# Patient Record
Sex: Female | Born: 1945 | Race: Black or African American | Hispanic: No | Marital: Married | Smoking: Never smoker
Health system: Southern US, Community
[De-identification: ages and names within clinical notes are randomized; demographics above are authoritative.]

## PROBLEM LIST (undated history)

## (undated) DIAGNOSIS — E785 Hyperlipidemia, unspecified: Secondary | ICD-10-CM

## (undated) DIAGNOSIS — I10 Essential (primary) hypertension: Secondary | ICD-10-CM

## (undated) HISTORY — PX: ABDOMINAL HYSTERECTOMY: SHX81

## (undated) HISTORY — PX: OTHER SURGICAL HISTORY: SHX169

---

## 2010-12-26 ENCOUNTER — Ambulatory Visit: Payer: Self-pay | Admitting: Internal Medicine

## 2010-12-28 ENCOUNTER — Ambulatory Visit: Payer: Self-pay | Admitting: Internal Medicine

## 2013-08-25 ENCOUNTER — Ambulatory Visit: Payer: Self-pay | Admitting: Physician Assistant

## 2013-11-02 ENCOUNTER — Ambulatory Visit: Payer: Self-pay | Admitting: Emergency Medicine

## 2013-11-02 LAB — RAPID STREP-A WITH REFLX: Micro Text Report: NEGATIVE

## 2013-11-05 LAB — BETA STREP CULTURE(ARMC)

## 2014-02-13 ENCOUNTER — Ambulatory Visit: Payer: Self-pay | Admitting: Emergency Medicine

## 2014-03-01 ENCOUNTER — Ambulatory Visit: Payer: Self-pay

## 2014-03-01 LAB — URINALYSIS, COMPLETE
Bilirubin,UR: NEGATIVE
Glucose,UR: NEGATIVE
Ketone: NEGATIVE
NITRITE: POSITIVE
Ph: 5.5 (ref 5.0–8.0)
Protein: NEGATIVE
SPECIFIC GRAVITY: 1.025 (ref 1.000–1.030)

## 2014-03-01 LAB — COMPREHENSIVE METABOLIC PANEL
ALK PHOS: 64 U/L
Albumin: 3.5 g/dL (ref 3.4–5.0)
Anion Gap: 3 — ABNORMAL LOW (ref 7–16)
BUN: 18 mg/dL (ref 7–18)
Bilirubin,Total: 0.6 mg/dL (ref 0.2–1.0)
CALCIUM: 9.5 mg/dL (ref 8.5–10.1)
Chloride: 102 mmol/L (ref 98–107)
Co2: 34 mmol/L — ABNORMAL HIGH (ref 21–32)
Creatinine: 0.87 mg/dL (ref 0.60–1.30)
EGFR (African American): >60
Glucose: 88 mg/dL (ref 65–99)
Osmolality: 279 (ref 275–301)
POTASSIUM: 2.9 mmol/L — AB (ref 3.5–5.1)
SGOT(AST): 25 U/L (ref 15–37)
SGPT (ALT): 22 U/L
Sodium: 139 mmol/L (ref 136–145)
Total Protein: 7 g/dL (ref 6.4–8.2)

## 2014-03-01 LAB — CBC WITH DIFFERENTIAL/PLATELET
BASOS ABS: 0.1 10*3/uL (ref 0.0–0.1)
Basophil %: 1.2 %
Eosinophil #: 0.2 10*3/uL (ref 0.0–0.7)
Eosinophil %: 4.1 %
HCT: 38.5 % (ref 35.0–47.0)
HGB: 12.8 g/dL (ref 12.0–16.0)
Lymphocyte #: 1.6 10*3/uL (ref 1.0–3.6)
Lymphocyte %: 31.8 %
MCH: 30 pg (ref 26.0–34.0)
MCHC: 33.3 g/dL (ref 32.0–36.0)
MCV: 90 fL (ref 80–100)
MONOS PCT: 7.2 %
Monocyte #: 0.4 x10 3/mm (ref 0.2–0.9)
Neutrophil #: 2.8 10*3/uL (ref 1.4–6.5)
Neutrophil %: 55.7 %
PLATELETS: 254 10*3/uL (ref 150–440)
RBC: 4.27 10*6/uL (ref 3.80–5.20)
RDW: 13.6 % (ref 11.5–14.5)
WBC: 5 10*3/uL (ref 3.6–11.0)

## 2014-03-01 LAB — LIPASE, BLOOD: Lipase: 156 U/L (ref 73–393)

## 2014-03-01 LAB — AMYLASE: Amylase: 75 U/L (ref 25–115)

## 2014-03-01 LAB — OCCULT BLOOD X 1 CARD TO LAB, STOOL: Occult Blood, Feces: NEGATIVE

## 2014-03-03 LAB — URINE CULTURE

## 2015-04-19 ENCOUNTER — Ambulatory Visit
Admission: EM | Admit: 2015-04-19 | Discharge: 2015-04-19 | Disposition: A | Discharge status: Home / Self Care | Payer: Medicare Other | Attending: Family Medicine | Admitting: Family Medicine

## 2015-04-19 ENCOUNTER — Encounter: Payer: Self-pay | Admitting: Emergency Medicine

## 2015-04-19 DIAGNOSIS — J0110 Acute frontal sinusitis, unspecified: Secondary | ICD-10-CM

## 2015-04-19 DIAGNOSIS — H6593 Unspecified nonsuppurative otitis media, bilateral: Secondary | ICD-10-CM

## 2015-04-19 DIAGNOSIS — J209 Acute bronchitis, unspecified: Secondary | ICD-10-CM | POA: Diagnosis not present

## 2015-04-19 HISTORY — DX: Essential (primary) hypertension: I10

## 2015-04-19 HISTORY — DX: Hyperlipidemia, unspecified: E78.5

## 2015-04-19 MED ORDER — SALINE SPRAY 0.65 % NA SOLN
2.0000 | NASAL | Status: AC
Start: 2015-04-19 — End: ?

## 2015-04-19 MED ORDER — FLUTICASONE PROPIONATE 50 MCG/ACT NA SUSP: 1.0000 | Freq: Two times a day (BID) | NASAL | Status: AC

## 2015-04-19 MED ORDER — DOXYCYCLINE HYCLATE 100 MG PO CAPS: 100.0000 mg | ORAL_CAPSULE | Freq: Two times a day (BID) | ORAL | Status: AC

## 2015-04-19 MED ORDER — IPRATROPIUM-ALBUTEROL 0.5-2.5 (3) MG/3ML IN SOLN
3.0000 mL | Freq: Four times a day (QID) | RESPIRATORY_TRACT | Status: DC
  Administered 2015-04-19: 3 mL via RESPIRATORY_TRACT

## 2015-04-19 NOTE — ED Provider Notes (Signed)
CSN: [id number]     Arrival date & time 04/19/15  1219 History   First MD Initiated Contact with Patient 04/19/15 1249     Chief Complaint  Patient presents with  . URI  . Laryngitis   (Consider location/radiation/quality/duration/timing/severity/associated sxs/prior Treatment) HPI Comments: Married african [place] female left work early as feeling worse and they made her work under ceiling fans worsened respiratory symptoms.  Started with green phlegm cough 22 Nov, sinus pressure/congestion right greater than left, frontal headache 22 Nov has resolved, lost her voice worst current symptoms along with post nasal drip  Needs work note  The history is provided by the patient.    Past Medical History  Diagnosis Date  . Hypertension   . Hyperlipidemia    Past Surgical History  Procedure Laterality Date  . Gallstone    . Abdominal hysterectomy     History reviewed. No pertinent family history. Social History  Substance Use Topics  . Smoking status: Never Smoker   . Smokeless tobacco: None  . Alcohol Use: No   OB History    No data available     Review of Systems  Constitutional: Negative for fever, chills, diaphoresis, activity change, appetite change, fatigue and unexpected weight change.  HENT: Positive for congestion, ear pain, nosebleeds, postnasal drip, sinus pressure, sore throat and voice change. Negative for dental problem, drooling, ear discharge, facial swelling, hearing loss, mouth sores, rhinorrhea, sneezing, tinnitus and trouble swallowing.   Eyes: Negative for photophobia, pain, discharge, redness, itching and visual disturbance.  Respiratory: Positive for cough. Negative for choking, chest tightness, shortness of breath, wheezing and stridor.   Cardiovascular: Negative for chest pain, palpitations and leg swelling.  Gastrointestinal: Negative for nausea, vomiting, abdominal pain, diarrhea, constipation, blood in stool and abdominal distention.  Endocrine:  Negative for cold intolerance and heat intolerance.  Genitourinary: Negative for dysuria, hematuria and difficulty urinating.  Musculoskeletal: Negative for myalgias, back pain, joint swelling, arthralgias, gait problem, neck pain and neck stiffness.  Skin: Negative for color change, pallor, rash and wound.  Allergic/Immunologic: Positive for environmental allergies. Negative for food allergies.  Neurological: Positive for headaches. Negative for dizziness, tremors, seizures, syncope, facial asymmetry, speech difficulty, weakness, light-headedness and numbness.  Hematological: Negative for adenopathy. Does not bruise/bleed easily.  Psychiatric/Behavioral: Negative for behavioral problems, confusion, sleep disturbance and agitation.    Allergies  Review of patient's allergies indicates no known allergies.  Home Medications   Prior to Admission medications   Medication Sig Start Date End Date Taking? Authorizing Provider  ezetimibe-simvastatin (VYTORIN) 10-40 MG tablet Take 1 tablet by mouth daily.   Yes Historical Provider, MD  potassium chloride SA (K-DUR,KLOR-CON) 20 MEQ tablet Take 20 mEq by mouth 2 (two) times daily.   Yes Historical Provider, MD  doxycycline (VIBRAMYCIN) 100 MG capsule Take 1 capsule (100 mg total) by mouth 2 (two) times daily. 04/19/15   Barbaraann Barthelina A Bronwyn Belasco, NP  fluticasone (FLONASE) 50 MCG/ACT nasal spray Place 1 spray into both nostrils 2 (two) times daily. 04/19/15 05/03/15  Barbaraann Barthelina A Jeanluc Wegman, NP  sodium chloride (OCEAN) 0.65 % SOLN nasal spray Place 2 sprays into both nostrils every 2 (two) hours while awake. 04/19/15   Barbaraann Barthelina A Caton Popowski, NP   Meds Ordered and Administered this Visit   Medications  ipratropium-albuterol (DUONEB) 0.5-2.5 (3) MG/3ML nebulizer solution 3 mL (3 mLs Nebulization Given 04/19/15 1301)    BP 140/78 mmHg  Pulse 65  Temp(Src) 97.3 F (36.3 C) (Tympanic)  Resp 18  Ht 5'  4" (1.626 m)  Wt 163 lb (73.936 kg)  BMI 27.97 kg/m2  SpO2  99% No data found.   Physical Exam  Constitutional: She is oriented to person, place, and time. She appears well-developed and well-nourished. She is active and cooperative.  Non-toxic appearance. She does not have a sickly appearance. She appears ill. No distress.  HENT:  Head: Normocephalic and atraumatic.  Right Ear: Hearing, external ear and ear canal normal. A middle ear effusion is present.  Left Ear: Hearing, external ear and ear canal normal. A middle ear effusion is present.  Nose: Mucosal edema and rhinorrhea present. No nose lacerations, sinus tenderness, nasal deformity, septal deviation or nasal septal hematoma. No epistaxis.  No foreign bodies. Right sinus exhibits no maxillary sinus tenderness and no frontal sinus tenderness. Left sinus exhibits no maxillary sinus tenderness and no frontal sinus tenderness.  Mouth/Throat: Uvula is midline and mucous membranes are normal. Mucous membranes are not pale, not dry and not cyanotic. She does not have dentures. No oral lesions. No trismus in the jaw. Normal dentition. No dental abscesses, uvula swelling, lacerations or dental caries. Posterior oropharyngeal edema and posterior oropharyngeal erythema present. No oropharyngeal exudate or tonsillar abscesses.  Cobblestoning posterior pharynx; bilateral TMs with air fluid level clear; hoarse voice; nonproductive cough; bilateral nasal turbinates with edema/boggy clear discharge   Eyes: Conjunctivae, EOM and lids are normal. Pupils are equal, round, and reactive to light. Right eye exhibits no chemosis, no discharge, no exudate and no hordeolum. No foreign body present in the right eye. Left eye exhibits no chemosis, no discharge, no exudate and no hordeolum. No foreign body present in the left eye. Right conjunctiva is not injected. Right conjunctiva has no hemorrhage. Left conjunctiva is not injected. Left conjunctiva has no hemorrhage. No scleral icterus. Right eye exhibits normal extraocular  motion and no nystagmus. Left eye exhibits normal extraocular motion and no nystagmus. Right pupil is round and reactive. Left pupil is round and reactive. Pupils are equal.  Neck: Trachea normal and normal range of motion. Neck supple. No tracheal tenderness, no spinous process tenderness and no muscular tenderness present. No rigidity. No tracheal deviation, no edema, no erythema and normal range of motion present. No thyroid mass and no thyromegaly present.  Cardiovascular: Normal rate, regular rhythm, S1 normal, S2 normal, normal heart sounds and intact distal pulses.  PMI is not displaced.  Exam reveals no gallop and no friction rub.   No murmur heard. Pulmonary/Chest: Effort normal. No accessory muscle usage or stridor. No respiratory distress. She has decreased breath sounds in the right lower field and the left lower field. She has no wheezes. She has rhonchi in the right middle field and the left middle field. She has no rales. She exhibits no tenderness.  Abdominal: Soft. She exhibits no distension.  Musculoskeletal: Normal range of motion. She exhibits no edema or tenderness.       Right shoulder: Normal.       Left shoulder: Normal.       Right hip: Normal.       Left hip: Normal.       Right knee: Normal.       Left knee: Normal.       Cervical back: Normal.       Right hand: Normal.       Left hand: Normal.  Lymphadenopathy:       Head (right side): No submental, no submandibular, no tonsillar, no preauricular, no posterior auricular and no occipital  adenopathy present.       Head (left side): No submental, no submandibular, no tonsillar, no preauricular, no posterior auricular and no occipital adenopathy present.    She has no cervical adenopathy.       Right cervical: No superficial cervical, no deep cervical and no posterior cervical adenopathy present.      Left cervical: No superficial cervical, no deep cervical and no posterior cervical adenopathy present.  Neurological: She  is alert and oriented to person, place, and time. She has normal strength. She is not disoriented. She displays no atrophy and no tremor. No cranial nerve deficit or sensory deficit. She exhibits normal muscle tone. She displays no seizure activity. Coordination and gait normal. GCS eye subscore is 4. GCS verbal subscore is 5. GCS motor subscore is 6.  Skin: Skin is warm, dry and intact. No abrasion, no bruising, no burn, no ecchymosis, no laceration, no lesion, no petechiae and no rash noted. She is not diaphoretic. No cyanosis or erythema. No pallor. Nails show no clubbing.  Psychiatric: She has a normal mood and affect. Her speech is normal and behavior is normal. Judgment and thought content normal. Cognition and memory are normal.  Nursing note and vitals reviewed.   ED Course  Procedures (including critical care time)  Labs Review Labs Reviewed - No data to display  Imaging Review No results found.  1335 Sp02 99% s/p duoneb treatment given by CMA Riesa Pope at [phone].  Patient doesn't feel any better but improved airflow throughout lung fields on repeat exam cough resolved voice still hoarse.  Offered albuterol MDI Rx for patient to use at home and she refused.  Continue plan of care as previously discussed and work note given for 24 hours.  Patient verbalized understanding of information/instructions, agreed with plan of care and had no further questions at this time.  MDM   1. Acute bronchitis, unspecified organism   2. Otitis media with effusion, bilateral   3. Acute frontal sinusitis, recurrence not specified    Supportive treatment.   No evidence of invasive bacterial infection, non toxic and well hydrated.  This is most likely self limiting viral infection.  I do not see where any further testing or imaging is necessary at this time.   I will suggest supportive care, rest, good hygiene and encourage the patient to take adequate fluids.  The patient is to return to clinic or  EMERGENCY ROOM if symptoms worsen or change significantly e.g. ear pain, fever, purulent discharge from ears or bleeding.  Exitcare handout on otitis media with effusion given to patient.  Patient verbalized agreement and understanding of treatment plan.    Suspect Viral illness: no evidence of invasive bacterial infection, non toxic and well hydrated.  This is most likely self limiting viral infection.  I do not see where any further testing or imaging is necessary at this time.   I will suggest supportive care, rest, good hygiene and encourage the patient to take adequate fluids.  Does not require work excuse x 24 hours.  flonase 1 spray each nostril BID prn, nasal saline 1-2 sprays each nostril prn q2h, if no improvement 48 hours on flonase start doxycycline  po BID x 10 days.  Discussed honey with lemon and salt water gargles for comfort also.  The patient is to return to clinic or EMERGENCY ROOM if symptoms worsen or change significantly e.g. fever, lethargy, SOB, wheezing.  Exitcare handout on viral illness given to patient.  Patient verbalized  agreement and understanding of treatment plan.   Bronchitis simple, community acquired, may have started as viral (probably respiratory syncytial, parainfluenza, influenza, or adenovirus), but now evidence of acute purulent bronchitis with resultant bronchial edema and mucus formation.  Viruses are the most common cause of bronchial inflammation in otherwise healthy adults with acute bronchitis.  The appearance of sputum is not predictive of whether a bacterial infection is present.  Purulent sputum is most often caused by viral infections.  There are a small portion of those caused by non-viral agents being Mycoplamsa pneumonia.  Microscopic examination or C&S of sputum in the healthy adult with acute bronchitis is generally not helpful (usually negative or normal respiratory flora) other considerations being cough from upper respiratory tract infections,  sinusitis or allergic syndromes (mild asthma or viral pneumonia).  Differential Diagnosis:  reactive airway disease (asthma, allergic aspergillosis (eosinophilia), chronic bronchitis, respiratory infection (Sinusitis, Common cold, pneumonia), congestive heart failure, reflux esophagitis, bronchogenic tumor, aspiration syndromes and/or exposure irritants/tobacco smoke.  In this case, there is no evidence of any invasive bacterial illness.  Most likely viral etiology so will hold on antibiotic treatment.  Advise supportive care with rest, encourage fluids, good hygiene and watch for any worsening symptoms.  If they were to develop:  come back to the office or go to the emergency room if after hours.  Without high fever, severe dyspnea, lack of physical findings or other risk factors, I will hold on a chest radiograph and CBC at this time.  I discussed that approximately 50% of patients with acute bronchitis have a cough that lasts up to three weeks, and 25% for over a month.  Tylenol, one to two tablets every four hours as needed for fever or myalgias.   No aspirin.  Patient instructed to follow up in one week or sooner if symptoms worsen. Patient verbalized agreement and understanding of treatment plan.  P2:  hand washing and cover cough  Rx flonase given.  Start doxycycline if no improvement x 48 hours flonase and saline use.  Humidifier at bedside.  Tylenol  po QID prn pain.  No evidence of systemic bacterial infection, non toxic and well hydrated.  I do not see where any further testing or imaging is necessary at this time.   I will suggest supportive care, rest, good hygiene and encourage the patient to take adequate fluids.  The patient is to return to clinic or EMERGENCY ROOM if symptoms worsen or change significantly.  Exitcare handout on sinusitis given to patient.  Patient verbalized agreement and understanding of treatment plan and had no further questions at this time.   P2:  Hand washing and  cover cough    Barbaraann Barthel, NP 04/19/15 1412

## 2015-04-19 NOTE — ED Notes (Signed)
Cough, congestion, runny nose for 4 days 

## 2015-04-19 NOTE — Discharge Instructions (Signed)
Acute Bronchitis °Bronchitis is inflammation of the airways that extend from the windpipe into the lungs (bronchi). The inflammation often causes mucus to develop. This leads to a cough, which is the most common symptom of bronchitis.  °In acute bronchitis, the condition usually develops suddenly and goes away over time, usually in a couple weeks. Smoking, allergies, and asthma can make bronchitis worse. Repeated episodes of bronchitis may cause further lung problems.  °CAUSES °Acute bronchitis is most often caused by the same virus that causes a cold. The virus can spread from person to person (contagious) through coughing, sneezing, and touching contaminated objects. °SIGNS AND SYMPTOMS  °· Cough.   °· Fever.   °· Coughing up mucus.   °· Body aches.   °· Chest congestion.   °· Chills.   °· Shortness of breath.   °· Sore throat.   °DIAGNOSIS  °Acute bronchitis is usually diagnosed through a physical exam. Your health care provider will also ask you questions about your medical history. Tests, such as chest X-rays, are sometimes done to rule out other conditions.  °TREATMENT  °Acute bronchitis usually goes away in a couple weeks. Oftentimes, no medical treatment is necessary. Medicines are sometimes given for relief of fever or cough. Antibiotic medicines are usually not needed but may be prescribed in certain situations. In some cases, an inhaler may be recommended to help reduce shortness of breath and control the cough. A cool mist vaporizer may also be used to help thin bronchial secretions and make it easier to clear the chest.  °HOME CARE INSTRUCTIONS °· Get plenty of rest.   °· Drink enough fluids to keep your urine clear or pale yellow (unless you have a medical condition that requires fluid restriction). Increasing fluids may help thin your respiratory secretions (sputum) and reduce chest congestion, and it will prevent dehydration.   °· Take medicines only as directed by your health care provider. °· If  you were prescribed an antibiotic medicine, finish it all even if you start to feel better. °· Avoid smoking and secondhand smoke. Exposure to cigarette smoke or irritating chemicals will make bronchitis worse. If you are a smoker, consider using nicotine gum or skin patches to help control withdrawal symptoms. Quitting smoking will help your lungs heal faster.   °· Reduce the chances of another bout of acute bronchitis by washing your hands frequently, avoiding people with cold symptoms, and trying not to touch your hands to your mouth, nose, or eyes.   °· Keep all follow-up visits as directed by your health care provider.   °SEEK MEDICAL CARE IF: °Your symptoms do not improve after 1 week of treatment.  °SEEK IMMEDIATE MEDICAL CARE IF: °· You develop an increased fever or chills.   °· You have chest pain.   °· You have severe shortness of breath. °· You have bloody sputum.   °· You develop dehydration. °· You faint or repeatedly feel like you are going to pass out. °· You develop repeated vomiting. °· You develop a severe headache. °MAKE SURE YOU:  °· Understand these instructions. °· Will watch your condition. °· Will get help right away if you are not doing well or get worse. °  °This information is not intended to replace advice given to you by your health care provider. Make sure you discuss any questions you have with your health care provider. °  °Document Released: 06/18/2004 Document Revised: 06/01/2014 Document Reviewed: 11/01/2012 °Elsevier Interactive Patient Education ©2016 [employer] °Sinusitis, Adult °Sinusitis is redness, soreness, and inflammation of the paranasal sinuses. Paranasal sinuses are air pockets within the   bones of your face. They are located beneath your eyes, in the middle of your forehead, and above your eyes. In healthy paranasal sinuses, mucus is able to drain out, and air is able to circulate through them by way of your nose. However, when your paranasal sinuses are inflamed,  mucus and air can become trapped. This can allow bacteria and other germs to grow and cause infection. °Sinusitis can develop quickly and last only a short time (acute) or continue over a long period (chronic). Sinusitis that lasts for more than 12 weeks is considered chronic. °CAUSES °Causes of sinusitis include: °· Allergies. °· Structural abnormalities, such as displacement of the cartilage that separates your nostrils (deviated septum), which can decrease the air flow through your nose and sinuses and affect sinus drainage. °· Functional abnormalities, such as when the small hairs (cilia) that line your sinuses and help remove mucus do not work properly or are not present. °SIGNS AND SYMPTOMS °Symptoms of acute and chronic sinusitis are the same. The primary symptoms are pain and pressure around the affected sinuses. Other symptoms include: °· Upper toothache. °· Earache. °· Headache. °· Bad breath. °· Decreased sense of smell and taste. °· A cough, which worsens when you are lying flat. °· Fatigue. °· Fever. °· Thick drainage from your nose, which often is green and may contain pus (purulent). °· Swelling and warmth over the affected sinuses. °DIAGNOSIS °Your health care provider will perform a physical exam. During your exam, your health care provider may perform any of the following to help determine if you have acute sinusitis or chronic sinusitis: °· Look in your nose for signs of abnormal growths in your nostrils (nasal polyps). °· Tap over the affected sinus to check for signs of infection. °· View the inside of your sinuses using an imaging device that has a light attached (endoscope). °If your health care provider suspects that you have chronic sinusitis, one or more of the following tests may be recommended: °· Allergy tests. °· Nasal culture. A sample of mucus is taken from your nose, sent to a lab, and screened for bacteria. °· Nasal cytology. A sample of mucus is taken from your nose and examined by  your health care provider to determine if your sinusitis is related to an allergy. °TREATMENT °Most cases of acute sinusitis are related to a viral infection and will resolve on their own within 10 days. Sometimes, medicines are prescribed to help relieve symptoms of both acute and chronic sinusitis. These may include pain medicines, decongestants, nasal steroid sprays, or saline sprays. °However, for sinusitis related to a bacterial infection, your health care provider will prescribe antibiotic medicines. These are medicines that will help kill the bacteria causing the infection. °Rarely, sinusitis is caused by a fungal infection. In these cases, your health care provider will prescribe antifungal medicine. °For some cases of chronic sinusitis, surgery is needed. Generally, these are cases in which sinusitis recurs more than 3 times per year, despite other treatments. °HOME CARE INSTRUCTIONS °· Drink plenty of water. Water helps thin the mucus so your sinuses can drain more easily. °· Use a humidifier. °· Inhale steam 3-4 times a day (for example, sit in the bathroom with the shower running). °· Apply a warm, moist washcloth to your face 3-4 times a day, or as directed by your health care provider. °· Use saline nasal sprays to help moisten and clean your sinuses. °· Take medicines only as directed by your health care provider. °· If   you were prescribed either an antibiotic or antifungal medicine, finish it all even if you start to feel better. °SEEK IMMEDIATE MEDICAL CARE IF: °· You have increasing pain or severe headaches. °· You have nausea, vomiting, or drowsiness. °· You have swelling around your face. °· You have vision problems. °· You have a stiff neck. °· You have difficulty breathing. °  °This information is not intended to replace advice given to you by your health care provider. Make sure you discuss any questions you have with your health care provider. °  °Document Released: 05/11/2005 Document  Revised: 06/01/2014 Document Reviewed: 05/26/2011 °Elsevier Interactive Patient Education ©2016 [employer] °Otitis Media With Effusion °Otitis media with effusion is the presence of fluid in the middle ear. This is a common problem in children, which often follows ear infections. It may be present for weeks or longer after the infection. Unlike an acute ear infection, otitis media with effusion refers only to fluid behind the ear drum and not infection. Children with repeated ear and sinus infections and allergy problems are the most likely to get otitis media with effusion. °CAUSES  °The most frequent cause of the fluid buildup is dysfunction of the eustachian tubes. These are the tubes that drain fluid in the ears to the back of the nose (nasopharynx). °SYMPTOMS  °· The main symptom of this condition is hearing loss. As a result, you or your child may: °· Listen to the TV at a loud volume. °· Not respond to questions. °· Ask "what" often when spoken to. °· Mistake or confuse one sound or word for another. °· There may be a sensation of fullness or pressure but usually not pain. °DIAGNOSIS  °· Your health care provider will diagnose this condition by examining you or your child's ears. °· Your health care provider may test the pressure in you or your child's ear with a tympanometer. °· A hearing test may be conducted if the problem persists. °TREATMENT  °· Treatment depends on the duration and the effects of the effusion. °· Antibiotics, decongestants, nose drops, and cortisone-type drugs (tablets or nasal spray) may not be helpful. °· Children with persistent ear effusions may have delayed language or behavioral problems. Children at risk for developmental delays in hearing, learning, and speech may require referral to a specialist earlier than children not at risk. °· You or your child's health care provider may suggest a referral to an ear, nose, and throat surgeon for treatment. The following may help  restore normal hearing: °· Drainage of fluid. °· Placement of ear tubes (tympanostomy tubes). °· Removal of adenoids (adenoidectomy). °HOME CARE INSTRUCTIONS  °· Avoid secondhand smoke. °· Infants who are breastfed are less likely to have this condition. °· Avoid feeding infants while they are lying flat. °· Avoid known environmental allergens. °· Avoid people who are sick. °SEEK MEDICAL CARE IF:  °· Hearing is not better in 3 months. °· Hearing is worse. °· Ear pain. °· Drainage from the ear. °· Dizziness. °MAKE SURE YOU:  °· Understand these instructions. °· Will watch your condition. °· Will get help right away if you are not doing well or get worse. °  °This information is not intended to replace advice given to you by your health care provider. Make sure you discuss any questions you have with your health care provider. °  °Document Released: 06/18/2004 Document Revised: 06/01/2014 Document Reviewed: 12/06/2012 °Elsevier Interactive Patient Education ©2016 Elsevier Inc. ° °

## 2018-01-08 ENCOUNTER — Emergency Department: Payer: No Typology Code available for payment source

## 2018-01-08 ENCOUNTER — Emergency Department
Admission: EM | Admit: 2018-01-08 | Discharge: 2018-01-08 | Disposition: A | Discharge status: Home / Self Care | Payer: No Typology Code available for payment source | Attending: Emergency Medicine | Admitting: Emergency Medicine

## 2018-01-08 ENCOUNTER — Other Ambulatory Visit: Payer: Self-pay

## 2018-01-08 ENCOUNTER — Encounter: Payer: Self-pay | Admitting: *Deleted

## 2018-01-08 DIAGNOSIS — I10 Essential (primary) hypertension: Secondary | ICD-10-CM | POA: Diagnosis not present

## 2018-01-08 DIAGNOSIS — S62115A Nondisplaced fracture of triquetrum [cuneiform] bone, left wrist, initial encounter for closed fracture: Secondary | ICD-10-CM | POA: Insufficient documentation

## 2018-01-08 DIAGNOSIS — Y92410 Unspecified street and highway as the place of occurrence of the external cause: Secondary | ICD-10-CM | POA: Insufficient documentation

## 2018-01-08 DIAGNOSIS — S46912A Strain of unspecified muscle, fascia and tendon at shoulder and upper arm level, left arm, initial encounter: Secondary | ICD-10-CM | POA: Insufficient documentation

## 2018-01-08 DIAGNOSIS — Y9389 Activity, other specified: Secondary | ICD-10-CM | POA: Diagnosis not present

## 2018-01-08 DIAGNOSIS — S9001XA Contusion of right ankle, initial encounter: Secondary | ICD-10-CM | POA: Diagnosis not present

## 2018-01-08 DIAGNOSIS — Z79899 Other long term (current) drug therapy: Secondary | ICD-10-CM | POA: Diagnosis not present

## 2018-01-08 DIAGNOSIS — R401 Stupor: Secondary | ICD-10-CM | POA: Insufficient documentation

## 2018-01-08 DIAGNOSIS — Y999 Unspecified external cause status: Secondary | ICD-10-CM | POA: Diagnosis not present

## 2018-01-08 DIAGNOSIS — S20212A Contusion of left front wall of thorax, initial encounter: Secondary | ICD-10-CM | POA: Insufficient documentation

## 2018-01-08 DIAGNOSIS — S6992XA Unspecified injury of left wrist, hand and finger(s), initial encounter: Secondary | ICD-10-CM | POA: Diagnosis present

## 2018-01-08 MED ORDER — IBUPROFEN 400 MG PO TABS: 400.0000 mg | ORAL_TABLET | Freq: Four times a day (QID) | ORAL | 0 refills | Status: AC | PRN

## 2018-01-08 MED ORDER — IBUPROFEN 400 MG PO TABS
400.0000 mg | ORAL_TABLET | Freq: Once | ORAL | Status: AC
  Administered 2018-01-08: 400 mg via ORAL

## 2018-01-08 MED ORDER — IBUPROFEN 400 MG PO TABS
ORAL_TABLET | ORAL | Status: AC
  Filled 2018-01-08: qty 1

## 2018-01-08 MED ORDER — TRAMADOL HCL 50 MG PO TABS: 50.0000 mg | ORAL_TABLET | Freq: Four times a day (QID) | ORAL | 0 refills | Status: AC | PRN

## 2018-01-08 MED ORDER — KETOROLAC TROMETHAMINE 30 MG/ML IJ SOLN
30.0000 mg | Freq: Once | INTRAMUSCULAR | Status: AC
  Administered 2018-01-08: 30 mg via INTRAMUSCULAR
  Filled 2018-01-08: qty 1

## 2018-01-08 MED ORDER — BACITRACIN ZINC 500 UNIT/GM EX OINT
TOPICAL_OINTMENT | CUTANEOUS | Status: AC
  Filled 2018-01-08: qty 0.9

## 2018-01-08 MED ORDER — BACITRACIN ZINC 500 UNIT/GM EX OINT
TOPICAL_OINTMENT | Freq: Once | CUTANEOUS | Status: AC
  Administered 2018-01-08: 1 via TOPICAL

## 2018-01-08 MED ORDER — HYDROCODONE-ACETAMINOPHEN 5-325 MG PO TABS
1.0000 | ORAL_TABLET | Freq: Once | ORAL | Status: AC
  Administered 2018-01-08: 1 via ORAL
  Filled 2018-01-08: qty 1
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
## 2018-01-08 NOTE — ED Notes (Signed)
This RN reviewed discharge instructions, follow-up care, prescriptions, cryotherapy, splint care, and need for elevation with patient. Patient verbalized understanding of all reviewed information.  Patient stable, with no distress noted at this time. 

## 2018-01-08 NOTE — ED Notes (Signed)
Bacitracin and clean sterile dressing applied to abrasion left shoulder per MD order.
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
## 2018-01-08 NOTE — Discharge Instructions (Signed)
You have a possible small fracture of the triquetrum bone in your left wrist.  Wear the wrist splint until you follow-up.  We have provided a referral to an orthopedic specialist.  You should take ibuprofen or Tylenol primarily for pain, and you can take the tramadol as needed for more severe pain.  Return to the ER for new, worsening, or persistent severe pain, difficulty breathing, weakness or numbness, or any other new or worsening symptoms that concern you.
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
## 2019-07-03 ENCOUNTER — Ambulatory Visit: Payer: Medicare Other | Attending: Internal Medicine

## 2019-07-03 DIAGNOSIS — Z23 Encounter for immunization: Secondary | ICD-10-CM

## 2019-07-03 NOTE — Progress Notes (Signed)
   Covid-19 Vaccination Clinic  Name:  Lumi Winslett    MRN: [id number] DOB: 1946/03/08  07/03/2019  Ms. Karapetyan was observed post Covid-19 immunization for 15 minutes without incidence. She was provided with Vaccine Information Sheet and instruction to access the V-Safe system.   Ms. Lauro was instructed to call 911 with any severe reactions post vaccine: Marland Kitchen Difficulty breathing  . Swelling of your face and throat  . A fast heartbeat  . A bad rash all over your body  . Dizziness and weakness    Immunizations Administered    Name Date Dose VIS Date Route   Moderna COVID-19 Vaccine 07/03/2019 12:47 PM 0.5 mL 04/25/2019 Intramuscular   Manufacturer: Moderna   Lot: [id number]   NDC: 07125-247-99

## 2019-08-02 ENCOUNTER — Ambulatory Visit: Payer: Medicare Other | Attending: Internal Medicine

## 2019-08-02 DIAGNOSIS — Z23 Encounter for immunization: Secondary | ICD-10-CM | POA: Insufficient documentation

## 2019-08-02 NOTE — Progress Notes (Signed)
   Covid-19 Vaccination Clinic  Name:  Lisa Frye    MRN: [id number] DOB: Sep 25, 1945  08/02/2019  Ms. Lisa Frye was observed post Covid-19 immunization for 15 minutes without incident. She was provided with Vaccine Information Sheet and instruction to access the V-Safe system.   Ms. Lisa Frye was instructed to call 911 with any severe reactions post vaccine: Marland Kitchen Difficulty breathing  . Swelling of face and throat  . A fast heartbeat  . A bad rash all over body  . Dizziness and weakness   Immunizations Administered    Name Date Dose VIS Date Route   Moderna COVID-19 Vaccine 08/02/2019 12:11 PM 0.5 mL 04/25/2019 Intramuscular   Manufacturer: Gala Murdoch   Lot: [id number]   NDC: 03524-818-59
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
